# Patient Record
Sex: Male | Born: 1965 | Race: White | Hispanic: No | Marital: Single | Smoking: Never smoker
Health system: Southern US, Community
[De-identification: ages and names within clinical notes are randomized; demographics above are authoritative.]

## PROBLEM LIST (undated history)

## (undated) DIAGNOSIS — Z86010 Personal history of colonic polyps: Secondary | ICD-10-CM

## (undated) DIAGNOSIS — C8590 Non-Hodgkin lymphoma, unspecified, unspecified site: Secondary | ICD-10-CM

## (undated) DIAGNOSIS — R011 Cardiac murmur, unspecified: Secondary | ICD-10-CM

## (undated) DIAGNOSIS — N200 Calculus of kidney: Secondary | ICD-10-CM

## (undated) HISTORY — PX: OTHER SURGICAL HISTORY: SHX169

## (undated) HISTORY — DX: Personal history of colonic polyps: Z86.010

## (undated) HISTORY — PX: AXILLARY LYMPH NODE DISSECTION: SHX5229

## (undated) HISTORY — PX: WISDOM TOOTH EXTRACTION: SHX21

## (undated) HISTORY — DX: Cardiac murmur, unspecified: R01.1

## (undated) HISTORY — DX: Non-Hodgkin lymphoma, unspecified, unspecified site: C85.90

## (undated) HISTORY — DX: Calculus of kidney: N20.0

## (undated) HISTORY — PX: COLONOSCOPY: SHX174

---

## 2009-02-26 ENCOUNTER — Emergency Department (HOSPITAL_COMMUNITY): Admission: EM | Admit: 2009-02-26 | Discharge: 2009-02-26 | Payer: Self-pay | Admitting: Emergency Medicine

## 2010-10-12 ENCOUNTER — Ambulatory Visit: Payer: Self-pay | Admitting: Oncology

## 2010-11-07 ENCOUNTER — Encounter (HOSPITAL_BASED_OUTPATIENT_CLINIC_OR_DEPARTMENT_OTHER): Payer: PRIVATE HEALTH INSURANCE | Admitting: Oncology

## 2010-11-07 DIAGNOSIS — C8589 Other specified types of non-Hodgkin lymphoma, extranodal and solid organ sites: Secondary | ICD-10-CM

## 2011-05-08 ENCOUNTER — Encounter (HOSPITAL_BASED_OUTPATIENT_CLINIC_OR_DEPARTMENT_OTHER): Payer: PRIVATE HEALTH INSURANCE | Admitting: Oncology

## 2011-05-08 DIAGNOSIS — C8589 Other specified types of non-Hodgkin lymphoma, extranodal and solid organ sites: Secondary | ICD-10-CM

## 2011-09-28 ENCOUNTER — Telehealth: Payer: Self-pay | Admitting: Oncology

## 2011-09-28 NOTE — Telephone Encounter (Signed)
Pt called to schedule his feb 2013 appt

## 2011-11-08 ENCOUNTER — Ambulatory Visit (HOSPITAL_BASED_OUTPATIENT_CLINIC_OR_DEPARTMENT_OTHER): Payer: PRIVATE HEALTH INSURANCE | Admitting: Oncology

## 2011-11-08 VITALS — BP 142/82 | HR 46 | Temp 97.2°F | Ht 74.0 in | Wt 189.4 lb

## 2011-11-08 DIAGNOSIS — C8589 Other specified types of non-Hodgkin lymphoma, extranodal and solid organ sites: Secondary | ICD-10-CM

## 2011-11-08 NOTE — Progress Notes (Signed)
OFFICE PROGRESS NOTE   INTERVAL HISTORY:   He returns as scheduled. There are no palpable lymph nodes. He feels well. He has a good appetite and energy level. He denies fever and night sweats.  Objective:  Vital signs in last 24 hours:  Blood pressure 142/82, pulse 46, temperature 97.2 F (36.2 C), temperature source Oral, height 6\' 2"  (1.88 m), weight 189 lb 6.4 oz (85.911 kg).    HEENT: Oropharynx without visible mass. Neck without mass. Lymphatics: No cervical, supraclavicular, axillary, inguinal node, or femoral nodes Resp: Lungs clear bilaterally Cardio: Regular rate and rhythm GI: No hepatosplenomegaly, no mass Vascular: No leg edema    Medications: I have reviewed the patient's current medications.  Assessment/Plan: 1. 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  a. Status post 4 cycles of R-CHOP chemotherapy completed in February 2009.   b. Status post consolidative radiation completed in April 2009.   Disposition:  He remains in clinical remission from non-Hodgkin's lymphoma. He would like to continue followup at the cancer Center. He will return for an office visit in October of 2013.   Lucile Shutters, MD  11/08/2011  12:37 PM

## 2012-07-07 ENCOUNTER — Ambulatory Visit (HOSPITAL_BASED_OUTPATIENT_CLINIC_OR_DEPARTMENT_OTHER): Payer: PRIVATE HEALTH INSURANCE | Admitting: Oncology

## 2012-07-07 VITALS — BP 143/76 | HR 48 | Temp 97.7°F | Resp 20 | Ht 74.0 in | Wt 188.5 lb

## 2012-07-07 DIAGNOSIS — C8589 Other specified types of non-Hodgkin lymphoma, extranodal and solid organ sites: Secondary | ICD-10-CM

## 2012-07-07 DIAGNOSIS — C8584 Other specified types of non-Hodgkin lymphoma, lymph nodes of axilla and upper limb: Secondary | ICD-10-CM

## 2012-07-07 NOTE — Progress Notes (Signed)
   Paul Frank    OFFICE PROGRESS NOTE   INTERVAL HISTORY:   He returns as scheduled. He feels well. He continues to exercise regularly. No fever, night sweats, anorexia, or palpable lymph nodes.  Objective:  Vital signs in last 24 hours:  Blood pressure 143/76, pulse 48, temperature 97.7 F (36.5 C), temperature source Oral, resp. rate 20, height 6\' 2"  (1.88 m), weight 188 lb 8 oz (85.503 kg).    HEENT: Oropharynx without visible mass, neck without mass Lymphatics: No cervical, supraclavicular, axillary, or inguinal nodes Resp: Lungs clear bilaterally Cardio: Regular rate and rhythm GI: No hepatosplenomegaly Vascular: No leg edema   Medications: I have reviewed the patient's current medications.  Assessment/Plan: 1. 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  a. Status post 4 cycles of R-CHOP chemotherapy completed in February 2009.  b. Status post consolidative radiation completed in April 2009.  Disposition:  He remains in clinical remission from the non-Hodgkin's lymphoma. He is now 5 years out from the time of diagnosis. Paul Frank has an excellent prognosis for a long-term disease-free survival. He plans to establish with Dr. Felipa Eth for internal medicine care. He was discharged from the oncology clinic today. He will contact us for any symptoms concerning for recurrent lymphoma. We will be glad to see him in the future as needed.   Paul Papas, MD  07/07/2012  2:17 PM

## 2013-09-22 ENCOUNTER — Telehealth: Payer: Self-pay | Admitting: *Deleted

## 2013-09-22 NOTE — Telephone Encounter (Signed)
Reports 4 week history of some numbness in his right hand/arm and axillary soreness (same area where I had my lymphoma). Asking to make appointment to see Dr. Truett Perna to evaluate. Suggested he first have his PCP evaluate this in case it could be related to cervical disc issue (he admits history of C1 Fx in past) or some other neuro/ortho issue. Made him aware it is unlikely it is related to his lymphoma. He agrees to call Dr. Felipa Eth to evaluate this. Made him aware that Dr. Truett Perna will be notified of his call.

## 2015-04-08 ENCOUNTER — Encounter: Payer: Self-pay | Admitting: Internal Medicine

## 2015-05-16 NOTE — Progress Notes (Deleted)
No allergies to egg or soy products. No anesthesia complications.  No home O2.  No diet meds.

## 2015-05-17 ENCOUNTER — Ambulatory Visit (AMBULATORY_SURGERY_CENTER): Payer: Self-pay | Admitting: *Deleted

## 2015-05-17 VITALS — Ht 74.0 in | Wt 186.0 lb

## 2015-05-17 DIAGNOSIS — Z1211 Encounter for screening for malignant neoplasm of colon: Secondary | ICD-10-CM

## 2015-05-17 NOTE — Progress Notes (Signed)
Patient denies any allergies to egg or soy products. Patient denies complications with anesthesia/sedation.  Patient denies oxygen use at home and denies diet medications. Emmi instructions for colonoscopy explained and given to patient.  

## 2015-06-07 ENCOUNTER — Encounter: Payer: Self-pay | Admitting: Internal Medicine

## 2015-06-07 ENCOUNTER — Ambulatory Visit (AMBULATORY_SURGERY_CENTER): Payer: BLUE CROSS/BLUE SHIELD | Admitting: Internal Medicine

## 2015-06-07 VITALS — BP 125/76 | HR 36 | Temp 96.8°F | Resp 36 | Ht 74.0 in | Wt 186.0 lb

## 2015-06-07 DIAGNOSIS — D123 Benign neoplasm of transverse colon: Secondary | ICD-10-CM

## 2015-06-07 DIAGNOSIS — Z1211 Encounter for screening for malignant neoplasm of colon: Secondary | ICD-10-CM

## 2015-06-07 DIAGNOSIS — D122 Benign neoplasm of ascending colon: Secondary | ICD-10-CM | POA: Diagnosis not present

## 2015-06-07 DIAGNOSIS — D124 Benign neoplasm of descending colon: Secondary | ICD-10-CM

## 2015-06-07 MED ORDER — SODIUM CHLORIDE 0.9 % IV SOLN: 500.0000 mL | INTRAVENOUS | Status: DC

## 2015-06-07 NOTE — Progress Notes (Signed)
Transferred to recovery room. A/O x3, pleased with MAC.  VSS.  Report to Celia, RN. 

## 2015-06-07 NOTE — Patient Instructions (Addendum)
I found and removed 6 small-medium polyps. All look benign. You also have a condition called diverticulosis - common and not usually a problem. Please read the handout provided.  I will let you know pathology results and when to have another routine colonoscopy by mail.  I appreciate the opportunity to care for you. Gatha Mayer, MD, [facility]  Discharge instructions given. Handouts on polyps and diverticulosis. Resume previous medications. YOU HAD AN ENDOSCOPIC PROCEDURE TODAY AT [facility]:   Refer to the procedure report that was given to you for any specific questions about what was found during the examination.  If the procedure report does not answer your questions, please call your gastroenterologist to clarify.  If you requested that your care partner not be given the details of your procedure findings, then the procedure report has been included in a sealed envelope for you to review at your convenience later.  YOU SHOULD EXPECT: Some feelings of bloating in the abdomen. Passage of more gas than usual.  Walking can help get rid of the air that was put into your GI tract during the procedure and reduce the bloating. If you had a lower endoscopy (such as a colonoscopy or flexible sigmoidoscopy) you may notice spotting of blood in your stool or on the toilet paper. If you underwent a bowel prep for your procedure, you may not have a normal bowel movement for a few days.  Please Note:  You might notice some irritation and congestion in your nose or some drainage.  This is from the oxygen used during your procedure.  There is no need for concern and it should clear up in a day or so.  SYMPTOMS TO REPORT IMMEDIATELY:   Following lower endoscopy (colonoscopy or flexible sigmoidoscopy):  Excessive amounts of blood in the stool  Significant tenderness or worsening of abdominal pains  Swelling of the abdomen that is new, acute  Fever of 100F or higher  For urgent or  emergent issues, a gastroenterologist can be reached at any hour by calling [phone].   DIET: Your first meal following the procedure should be a small meal and then it is ok to progress to your normal diet. Heavy or fried foods are harder to digest and may make you feel nauseous or bloated.  Likewise, meals heavy in dairy and vegetables can increase bloating.  Drink plenty of fluids but you should avoid alcoholic beverages for 24 hours.  ACTIVITY:  You should plan to take it easy for the rest of today and you should NOT DRIVE or use heavy machinery until tomorrow (because of the sedation medicines used during the test).    FOLLOW UP: Our staff will call the number listed on your records the next business day following your procedure to check on you and address any questions or concerns that you may have regarding the information given to you following your procedure. If we do not reach you, we will leave a message.  However, if you are feeling well and you are not experiencing any problems, there is no need to return our call.  We will assume that you have returned to your regular daily activities without incident.  If any biopsies were taken you will be contacted by phone or by letter within the next 1-3 weeks.  Please call us at [phone] if you have not heard about the biopsies in 3 weeks.    SIGNATURES/CONFIDENTIALITY: You and/or your care partner have signed paperwork which  will be entered into your electronic medical record.  These signatures attest to the fact that that the information above on your After Visit Summary has been reviewed and is understood.  Full responsibility of the confidentiality of this discharge information lies with you and/or your care-partner.

## 2015-06-07 NOTE — Op Note (Signed)
[facility]  [address] [place], [address]   COLONOSCOPY PROCEDURE REPORT  PATIENT: Paul Frank, Paul Frank  MR#: [id number] BIRTHDATE: [birth year] , 48  yrs. old GENDER: male ENDOSCOPIST: Gatha Mayer, MD, [facility] PROCEDURE DATE:  06/07/2015 PROCEDURE:   Colonoscopy, screening, Colonoscopy with snare polypectomy, and Colonoscopy with biopsy First Screening Colonoscopy - Avg.  risk and is 50 yrs.  old or older Yes.  Prior Negative Screening - Now for repeat screening. N/A  History of Adenoma - Now for follow-up colonoscopy & has been > or = to 3 yrs.  N/A  Polyps removed today? Yes ASA CLASS:   Class I INDICATIONS:Screening for colonic neoplasia and Colorectal Neoplasm Risk Assessment for this procedure is average risk. MEDICATIONS: Propofol 300 mg IV and Monitored anesthesia care  DESCRIPTION OF PROCEDURE:   After the risks benefits and alternatives of the procedure were thoroughly explained, informed consent was obtained.  The digital rectal exam revealed no abnormalities of the rectum, revealed the prostate was not enlarged, and revealed no prostatic nodules.   The LB [id number]  endoscope was introduced through the anus and advanced to the cecum, which was identified by both the appendix and ileocecal valve. No adverse events experienced.   The quality of the prep was excellent.  (MiraLax was used)  The instrument was then slowly withdrawn as the colon was fully examined. Estimated blood loss is zero unless otherwise noted in this procedure report.  COLON FINDINGS: Six polypoid shaped sessile polyps ranging from 2 to 55mm in size were found in the ascending colon, transverse colon, and descending colon.  Polypectomies were performed with cold forceps and with a cold snare.  The resection was complete, the polyp tissue was completely retrieved and sent to histology. There was severe diverticulosis noted in the left colon.   The examination was otherwise normal.   Retroflexed views revealed no abnormalities. The time to cecum = 3.1 Withdrawal time = 21.9   The scope was withdrawn and the procedure completed. COMPLICATIONS: There were no immediate complications.  ENDOSCOPIC IMPRESSION: 1.   Six sessile polyps ranging from 2 to 62mm in size were found in the ascending colon (10 mm cold snare), transverse colon (diminutive cold bx), and descending colon (8 mm cold snare and 2 diminutive cold bx); polypectomies were performed with cold forceps and with a cold snare 2.   Severe diverticulosis was noted in the left colon 3.   The examination was otherwise normal - excellent prep - first screening  RECOMMENDATIONS: Timing of repeat colonoscopy will be determined by pathology findings.  eSigned:  Gatha Mayer, MD, [facility] 06/07/2015 9:18 AM   cc: Dr. Berneta Sages and The Patient

## 2015-06-07 NOTE — Progress Notes (Signed)
Called to room to assist during endoscopic procedure.  Patient ID and intended procedure confirmed with present staff. Received instructions for my participation in the procedure from the performing physician.  

## 2015-06-08 ENCOUNTER — Telehealth: Payer: Self-pay | Admitting: *Deleted

## 2015-06-08 NOTE — Telephone Encounter (Signed)
  Follow up Call-  Call back number 06/07/2015  Post procedure Call Back phone  # [phone]  Permission to leave phone message No     Patient questions:  Do you have a fever, pain , or abdominal swelling? No. Pain Score  0 *  Have you tolerated food without any problems? Yes.    Have you been able to return to your normal activities? Yes.    Do you have any questions about your discharge instructions: Diet   No. Medications  No. Follow up visit  No.  Do you have questions or concerns about your Care? No.  Actions: * If pain score is 4 or above: No action needed, pain <4.

## 2015-06-13 ENCOUNTER — Encounter: Payer: Self-pay | Admitting: Internal Medicine

## 2015-06-13 DIAGNOSIS — Z86010 Personal history of colonic polyps: Secondary | ICD-10-CM

## 2015-06-13 HISTORY — DX: Personal history of colonic polyps: Z86.010

## 2015-06-13 NOTE — Progress Notes (Signed)
Quick Note:  Adenomas and ssp - 6 polyps and max 10 mm Repeat colon 2019 ______

## 2015-06-27 ENCOUNTER — Encounter: Payer: PRIVATE HEALTH INSURANCE | Admitting: Internal Medicine

## 2016-04-06 DIAGNOSIS — Z0000 Encounter for general adult medical examination without abnormal findings: Secondary | ICD-10-CM | POA: Diagnosis not present

## 2016-04-06 DIAGNOSIS — Z125 Encounter for screening for malignant neoplasm of prostate: Secondary | ICD-10-CM | POA: Diagnosis not present

## 2016-04-13 DIAGNOSIS — Z0000 Encounter for general adult medical examination without abnormal findings: Secondary | ICD-10-CM | POA: Diagnosis not present

## 2016-04-13 DIAGNOSIS — M1990 Unspecified osteoarthritis, unspecified site: Secondary | ICD-10-CM | POA: Diagnosis not present

## 2016-04-13 DIAGNOSIS — Z1212 Encounter for screening for malignant neoplasm of rectum: Secondary | ICD-10-CM | POA: Diagnosis not present

## 2016-04-13 DIAGNOSIS — C8590 Non-Hodgkin lymphoma, unspecified, unspecified site: Secondary | ICD-10-CM | POA: Diagnosis not present

## 2016-04-13 DIAGNOSIS — D126 Benign neoplasm of colon, unspecified: Secondary | ICD-10-CM | POA: Diagnosis not present

## 2016-04-13 DIAGNOSIS — Z1389 Encounter for screening for other disorder: Secondary | ICD-10-CM | POA: Diagnosis not present

## 2016-07-28 DIAGNOSIS — Z23 Encounter for immunization: Secondary | ICD-10-CM | POA: Diagnosis not present

## 2017-05-13 DIAGNOSIS — Z0000 Encounter for general adult medical examination without abnormal findings: Secondary | ICD-10-CM | POA: Diagnosis not present

## 2017-05-13 DIAGNOSIS — Z125 Encounter for screening for malignant neoplasm of prostate: Secondary | ICD-10-CM | POA: Diagnosis not present

## 2017-05-14 DIAGNOSIS — Z1389 Encounter for screening for other disorder: Secondary | ICD-10-CM | POA: Diagnosis not present

## 2017-05-14 DIAGNOSIS — Z6823 Body mass index (BMI) 23.0-23.9, adult: Secondary | ICD-10-CM | POA: Diagnosis not present

## 2017-05-14 DIAGNOSIS — D126 Benign neoplasm of colon, unspecified: Secondary | ICD-10-CM | POA: Diagnosis not present

## 2017-05-14 DIAGNOSIS — Z0000 Encounter for general adult medical examination without abnormal findings: Secondary | ICD-10-CM | POA: Diagnosis not present

## 2017-05-14 DIAGNOSIS — C8590 Non-Hodgkin lymphoma, unspecified, unspecified site: Secondary | ICD-10-CM | POA: Diagnosis not present

## 2017-05-14 DIAGNOSIS — M1990 Unspecified osteoarthritis, unspecified site: Secondary | ICD-10-CM | POA: Diagnosis not present

## 2017-05-17 DIAGNOSIS — Z1212 Encounter for screening for malignant neoplasm of rectum: Secondary | ICD-10-CM | POA: Diagnosis not present

## 2017-07-13 DIAGNOSIS — Z23 Encounter for immunization: Secondary | ICD-10-CM | POA: Diagnosis not present

## 2017-08-01 DIAGNOSIS — D2362 Other benign neoplasm of skin of left upper limb, including shoulder: Secondary | ICD-10-CM | POA: Diagnosis not present

## 2017-08-01 DIAGNOSIS — L821 Other seborrheic keratosis: Secondary | ICD-10-CM | POA: Diagnosis not present

## 2017-08-01 DIAGNOSIS — D225 Melanocytic nevi of trunk: Secondary | ICD-10-CM | POA: Diagnosis not present

## 2017-08-01 DIAGNOSIS — D1801 Hemangioma of skin and subcutaneous tissue: Secondary | ICD-10-CM | POA: Diagnosis not present

## 2017-08-01 DIAGNOSIS — D485 Neoplasm of uncertain behavior of skin: Secondary | ICD-10-CM | POA: Diagnosis not present

## 2017-08-01 DIAGNOSIS — D2339 Other benign neoplasm of skin of other parts of face: Secondary | ICD-10-CM | POA: Diagnosis not present

## 2018-06-04 DIAGNOSIS — Z0000 Encounter for general adult medical examination without abnormal findings: Secondary | ICD-10-CM | POA: Diagnosis not present

## 2018-06-04 DIAGNOSIS — Z125 Encounter for screening for malignant neoplasm of prostate: Secondary | ICD-10-CM | POA: Diagnosis not present

## 2018-06-11 DIAGNOSIS — Z1389 Encounter for screening for other disorder: Secondary | ICD-10-CM | POA: Diagnosis not present

## 2018-06-11 DIAGNOSIS — C8590 Non-Hodgkin lymphoma, unspecified, unspecified site: Secondary | ICD-10-CM | POA: Diagnosis not present

## 2018-06-11 DIAGNOSIS — Z0000 Encounter for general adult medical examination without abnormal findings: Secondary | ICD-10-CM | POA: Diagnosis not present

## 2018-06-11 DIAGNOSIS — Z23 Encounter for immunization: Secondary | ICD-10-CM | POA: Diagnosis not present

## 2018-06-11 DIAGNOSIS — D126 Benign neoplasm of colon, unspecified: Secondary | ICD-10-CM | POA: Diagnosis not present

## 2018-06-11 DIAGNOSIS — M1990 Unspecified osteoarthritis, unspecified site: Secondary | ICD-10-CM | POA: Diagnosis not present

## 2018-06-13 DIAGNOSIS — Z1212 Encounter for screening for malignant neoplasm of rectum: Secondary | ICD-10-CM | POA: Diagnosis not present

## 2018-06-23 ENCOUNTER — Encounter: Payer: Self-pay | Admitting: Internal Medicine

## 2018-06-24 ENCOUNTER — Telehealth: Payer: Self-pay | Admitting: Oncology

## 2018-06-24 NOTE — Telephone Encounter (Signed)
Printed medical records for patient to pick up, Release [id number]

## 2018-07-25 ENCOUNTER — Ambulatory Visit (AMBULATORY_SURGERY_CENTER): Payer: Self-pay | Admitting: *Deleted

## 2018-07-25 ENCOUNTER — Other Ambulatory Visit: Payer: Self-pay

## 2018-07-25 VITALS — Ht 74.0 in | Wt 182.0 lb

## 2018-07-25 DIAGNOSIS — Z86010 Personal history of colonic polyps: Secondary | ICD-10-CM

## 2018-07-25 NOTE — Progress Notes (Signed)
No egg or soy allergy known to patient  No issues with past sedation with any surgeries  or procedures, no intubation problems  No diet pills per patient No home 02 use per patient  No blood thinners per patient  Pt denies issues with constipation  No A fib or A flutter  EMMI video sent to pt's e mail  

## 2018-07-28 ENCOUNTER — Encounter: Payer: Self-pay | Admitting: Internal Medicine

## 2018-08-08 ENCOUNTER — Encounter: Payer: Self-pay | Admitting: Internal Medicine

## 2018-08-08 ENCOUNTER — Ambulatory Visit (AMBULATORY_SURGERY_CENTER): Payer: BLUE CROSS/BLUE SHIELD | Admitting: Internal Medicine

## 2018-08-08 VITALS — BP 108/66 | HR 53 | Temp 97.8°F | Resp 12 | Ht 74.0 in | Wt 182.0 lb

## 2018-08-08 DIAGNOSIS — K635 Polyp of colon: Secondary | ICD-10-CM | POA: Diagnosis not present

## 2018-08-08 DIAGNOSIS — Z86010 Personal history of colonic polyps: Secondary | ICD-10-CM | POA: Diagnosis not present

## 2018-08-08 DIAGNOSIS — Z1211 Encounter for screening for malignant neoplasm of colon: Secondary | ICD-10-CM | POA: Diagnosis not present

## 2018-08-08 DIAGNOSIS — D124 Benign neoplasm of descending colon: Secondary | ICD-10-CM

## 2018-08-08 DIAGNOSIS — D127 Benign neoplasm of rectosigmoid junction: Secondary | ICD-10-CM | POA: Diagnosis not present

## 2018-08-08 DIAGNOSIS — D125 Benign neoplasm of sigmoid colon: Secondary | ICD-10-CM

## 2018-08-08 DIAGNOSIS — D129 Benign neoplasm of anus and anal canal: Secondary | ICD-10-CM

## 2018-08-08 DIAGNOSIS — D128 Benign neoplasm of rectum: Secondary | ICD-10-CM

## 2018-08-08 DIAGNOSIS — K621 Rectal polyp: Secondary | ICD-10-CM | POA: Diagnosis not present

## 2018-08-08 MED ORDER — SODIUM CHLORIDE 0.9 % IV SOLN: 500.0000 mL | Freq: Once | INTRAVENOUS | Status: AC

## 2018-08-08 NOTE — Progress Notes (Signed)
To PACU, VSS. Report to [email or web address] 

## 2018-08-08 NOTE — Op Note (Signed)
[facility] Patient Name: Paul Frank Procedure Date: 08/08/2018 9:26 AM MRN: [id number] Endoscopist: Gatha Mayer , MD Age: 52 Referring MD:  Date of Birth: 03-02-1966 Gender: Male Account #: [id number] Procedure:                Colonoscopy Indications:              Surveillance: Personal history of adenomatous                            polyps on last colonoscopy 3 years ago Medicines:                Propofol per Anesthesia, Monitored Anesthesia Care Procedure:                Pre-Anesthesia Assessment:                           - Prior to the procedure, a History and Physical                            was performed, and patient medications and                            allergies were reviewed. The patient's tolerance of                            previous anesthesia was also reviewed. The risks                            and benefits of the procedure and the sedation                            options and risks were discussed with the patient.                            All questions were answered, and informed consent                            was obtained. Prior Anticoagulants: The patient has                            taken no previous anticoagulant or antiplatelet                            agents. ASA Grade Assessment: II - A patient with                            mild systemic disease. After reviewing the risks                            and benefits, the patient was deemed in                            satisfactory condition to undergo the procedure.  After obtaining informed consent, the colonoscope                            was passed under direct vision. Throughout the                            procedure, the patient's blood pressure, pulse, and                            oxygen saturations were monitored continuously. The                            Model CF-[id number]) scope was introduced   through the anus and advanced to the the cecum,                            identified by appendiceal orifice and ileocecal                            valve. The colonoscopy was performed without                            difficulty. The patient tolerated the procedure                            well. The quality of the bowel preparation was                            excellent. The ileocecal valve, appendiceal                            orifice, and rectum were photographed. Scope In: 9:35:40 AM Scope Out: 9:59:53 AM Scope Withdrawal Time: 0 hours 19 minutes 54 seconds  Total Procedure Duration: 0 hours 24 minutes 13 seconds  Findings:                 The perianal and digital rectal examinations were                            normal. Pertinent negatives include normal prostate                            (size, shape, and consistency).                           Scattered small-mouthed diverticula were found in                            the entire colon.                           Five sessile polyps were found in the rectum,                            sigmoid colon and descending colon. The polyps were  diminutive in size. These polyps were removed with                            a cold snare. Resection and retrieval were                            complete. Verification of patient identification                            for the specimen was done. Estimated blood loss was                            minimal.                           The exam was otherwise without abnormality on                            direct and retroflexion views. Complications:            No immediate complications. Estimated Blood Loss:     Estimated blood loss was minimal. Impression:               - Diverticulosis in the entire examined colon.                           - Five diminutive polyps in the rectum, in the                            sigmoid colon and in the descending colon,  removed                            with a cold snare. Resected and retrieved.                           - The examination was otherwise normal on direct                            and retroflexion views.                           - Personal history of colonic polyps. Adenomas and                            ssp's 2016 Recommendation:           - Patient has a contact number available for                            emergencies. The signs and symptoms of potential                            delayed complications were discussed with the                            patient. Return to normal  activities tomorrow.                            Written discharge instructions were provided to the                            patient.                           - Resume previous diet.                           - Continue present medications.                           - Repeat colonoscopy is recommended for                            surveillance. The colonoscopy date will be                            determined after pathology results from today's                            exam become available for review. Gatha Mayer, MD 08/08/2018 10:14:18 AM This report has been signed electronically.

## 2018-08-08 NOTE — Progress Notes (Signed)
Patient denies any changes in medical, surgical or medicine hx since PV.

## 2018-08-08 NOTE — Progress Notes (Signed)
Called to room to assist during endoscopic procedure.  Patient ID and intended procedure confirmed with present staff. Received instructions for my participation in the procedure from the performing physician.  

## 2018-08-08 NOTE — Patient Instructions (Addendum)
I found and removed 5 tiny polyps.  You still have diverticulosis - thickened muscle rings and pouches in the colon wall. Please read the handout about this condition.  I appreciate the opportunity to care for you. Gatha Mayer, MD, FACG    YOU HAD AN ENDOSCOPIC PROCEDURE TODAY AT [facility]:   Refer to the procedure report that was given to you for any specific questions about what was found during the examination.  If the procedure report does not answer your questions, please call your gastroenterologist to clarify.  If you requested that your care partner not be given the details of your procedure findings, then the procedure report has been included in a sealed envelope for you to review at your convenience later.  YOU SHOULD EXPECT: Some feelings of bloating in the abdomen. Passage of more gas than usual.  Walking can help get rid of the air that was put into your GI tract during the procedure and reduce the bloating. If you had a lower endoscopy (such as a colonoscopy or flexible sigmoidoscopy) you may notice spotting of blood in your stool or on the toilet paper. If you underwent a bowel prep for your procedure, you may not have a normal bowel movement for a few days.  Please Note:  You might notice some irritation and congestion in your nose or some drainage.  This is from the oxygen used during your procedure.  There is no need for concern and it should clear up in a day or so.  SYMPTOMS TO REPORT IMMEDIATELY:   Following lower endoscopy (colonoscopy or flexible sigmoidoscopy):  Excessive amounts of blood in the stool  Significant tenderness or worsening of abdominal pains  Swelling of the abdomen that is new, acute  Fever of 100F or higher   For urgent or emergent issues, a gastroenterologist can be reached at any hour by calling [phone].   DIET:  We do recommend a small meal at first, but then you may proceed to your regular diet.  Drink  plenty of fluids but you should avoid alcoholic beverages for 24 hours.  ACTIVITY:  You should plan to take it easy for the rest of today and you should NOT DRIVE or use heavy machinery until tomorrow (because of the sedation medicines used during the test).    FOLLOW UP: Our staff will call the number listed on your records the next business day following your procedure to check on you and address any questions or concerns that you may have regarding the information given to you following your procedure. If we do not reach you, we will leave a message.  However, if you are feeling well and you are not experiencing any problems, there is no need to return our call.  We will assume that you have returned to your regular daily activities without incident.  If any biopsies were taken you will be contacted by phone or by letter within the next 1-3 weeks.  Please call us at [phone] if you have not heard about the biopsies in 3 weeks.    SIGNATURES/CONFIDENTIALITY: You and/or your care partner have signed paperwork which will be entered into your electronic medical record.  These signatures attest to the fact that that the information above on your After Visit Summary has been reviewed and is understood.  Full responsibility of the confidentiality of this discharge information lies with you and/or your care-partner.  Read all of the handouts given to  you by your recovery room nurse.

## 2018-08-11 ENCOUNTER — Telehealth: Payer: Self-pay

## 2018-08-11 NOTE — Telephone Encounter (Signed)
  Follow up Call-  Call back number 08/08/2018  Post procedure Call Back phone  # [phone]  Permission to leave phone message Yes  Some recent data might be hidden     Patient questions:  Do you have a fever, pain , or abdominal swelling? No. Pain Score  0 *  Have you tolerated food without any problems? Yes.    Have you been able to return to your normal activities? Yes.    Do you have any questions about your discharge instructions: Diet   No. Medications  No. Follow up visit  No.  Do you have questions or concerns about your Care? No.  Actions: * If pain score is 4 or above: No action needed, pain <4.

## 2018-08-20 ENCOUNTER — Encounter: Payer: Self-pay | Admitting: Internal Medicine

## 2018-08-20 DIAGNOSIS — Z860101 Personal history of adenomatous and serrated colon polyps: Secondary | ICD-10-CM

## 2018-08-20 DIAGNOSIS — Z86010 Personal history of colonic polyps: Secondary | ICD-10-CM

## 2018-08-20 NOTE — Progress Notes (Signed)
Diminutive hyperplastics desc, simoid and rectum 5 total Hx adenomas ssp's 2016  Recall 2022

## 2019-06-26 DIAGNOSIS — Z0000 Encounter for general adult medical examination without abnormal findings: Secondary | ICD-10-CM | POA: Diagnosis not present

## 2019-06-26 DIAGNOSIS — Z125 Encounter for screening for malignant neoplasm of prostate: Secondary | ICD-10-CM | POA: Diagnosis not present

## 2019-06-26 DIAGNOSIS — R945 Abnormal results of liver function studies: Secondary | ICD-10-CM | POA: Diagnosis not present

## 2019-07-03 DIAGNOSIS — Z23 Encounter for immunization: Secondary | ICD-10-CM | POA: Diagnosis not present

## 2019-07-03 DIAGNOSIS — Z125 Encounter for screening for malignant neoplasm of prostate: Secondary | ICD-10-CM | POA: Diagnosis not present

## 2019-07-03 DIAGNOSIS — C8590 Non-Hodgkin lymphoma, unspecified, unspecified site: Secondary | ICD-10-CM | POA: Diagnosis not present

## 2019-07-03 DIAGNOSIS — R945 Abnormal results of liver function studies: Secondary | ICD-10-CM | POA: Diagnosis not present

## 2019-07-03 DIAGNOSIS — Z1331 Encounter for screening for depression: Secondary | ICD-10-CM | POA: Diagnosis not present

## 2019-07-03 DIAGNOSIS — M1990 Unspecified osteoarthritis, unspecified site: Secondary | ICD-10-CM | POA: Diagnosis not present

## 2019-07-03 DIAGNOSIS — Z0000 Encounter for general adult medical examination without abnormal findings: Secondary | ICD-10-CM | POA: Diagnosis not present

## 2019-07-03 DIAGNOSIS — D126 Benign neoplasm of colon, unspecified: Secondary | ICD-10-CM | POA: Diagnosis not present

## 2019-07-09 DIAGNOSIS — Z1212 Encounter for screening for malignant neoplasm of rectum: Secondary | ICD-10-CM | POA: Diagnosis not present

## 2019-07-09 DIAGNOSIS — R945 Abnormal results of liver function studies: Secondary | ICD-10-CM | POA: Diagnosis not present

## 2019-07-15 ENCOUNTER — Other Ambulatory Visit: Payer: Self-pay | Admitting: Internal Medicine

## 2019-07-15 DIAGNOSIS — R945 Abnormal results of liver function studies: Secondary | ICD-10-CM

## 2019-07-15 DIAGNOSIS — R7989 Other specified abnormal findings of blood chemistry: Secondary | ICD-10-CM

## 2019-07-22 ENCOUNTER — Ambulatory Visit
Admission: RE | Admit: 2019-07-22 | Discharge: 2019-07-22 | Disposition: A | Discharge status: Home / Self Care | Payer: BC Managed Care – PPO | Source: Ambulatory Visit | Attending: Internal Medicine | Admitting: Internal Medicine

## 2019-07-22 DIAGNOSIS — R945 Abnormal results of liver function studies: Secondary | ICD-10-CM

## 2019-07-22 DIAGNOSIS — R7989 Other specified abnormal findings of blood chemistry: Secondary | ICD-10-CM

## 2019-07-22 DIAGNOSIS — K828 Other specified diseases of gallbladder: Secondary | ICD-10-CM | POA: Diagnosis not present

## 2019-07-25 ENCOUNTER — Encounter (INDEPENDENT_AMBULATORY_CARE_PROVIDER_SITE_OTHER): Payer: Self-pay

## 2019-08-05 DIAGNOSIS — R945 Abnormal results of liver function studies: Secondary | ICD-10-CM | POA: Diagnosis not present

## 2019-08-14 ENCOUNTER — Other Ambulatory Visit: Payer: Self-pay | Admitting: Internal Medicine

## 2019-08-14 DIAGNOSIS — R945 Abnormal results of liver function studies: Secondary | ICD-10-CM

## 2019-08-14 DIAGNOSIS — R7989 Other specified abnormal findings of blood chemistry: Secondary | ICD-10-CM

## 2019-08-25 ENCOUNTER — Other Ambulatory Visit: Payer: Self-pay

## 2019-08-25 ENCOUNTER — Ambulatory Visit
Admission: RE | Admit: 2019-08-25 | Discharge: 2019-08-25 | Disposition: A | Discharge status: Home / Self Care | Payer: BC Managed Care – PPO | Source: Ambulatory Visit | Attending: Internal Medicine | Admitting: Internal Medicine

## 2019-08-25 DIAGNOSIS — R945 Abnormal results of liver function studies: Secondary | ICD-10-CM

## 2019-08-25 DIAGNOSIS — R7989 Other specified abnormal findings of blood chemistry: Secondary | ICD-10-CM

## 2019-08-25 MED ORDER — IOPAMIDOL (ISOVUE-300) INJECTION 61%
100.0000 mL | Freq: Once | INTRAVENOUS | Status: AC | PRN
  Administered 2019-08-25: 100 mL via INTRAVENOUS

## 2019-09-03 DIAGNOSIS — M25561 Pain in right knee: Secondary | ICD-10-CM | POA: Diagnosis not present

## 2019-09-23 DIAGNOSIS — R945 Abnormal results of liver function studies: Secondary | ICD-10-CM | POA: Diagnosis not present

## 2019-10-09 DIAGNOSIS — L821 Other seborrheic keratosis: Secondary | ICD-10-CM | POA: Diagnosis not present

## 2019-10-09 DIAGNOSIS — L820 Inflamed seborrheic keratosis: Secondary | ICD-10-CM | POA: Diagnosis not present

## 2019-11-04 DIAGNOSIS — T84194A Other mechanical complication of internal fixation device of right femur, initial encounter: Secondary | ICD-10-CM | POA: Diagnosis not present

## 2019-11-04 DIAGNOSIS — X58XXXA Exposure to other specified factors, initial encounter: Secondary | ICD-10-CM | POA: Diagnosis not present

## 2019-11-04 DIAGNOSIS — S83271A Complex tear of lateral meniscus, current injury, right knee, initial encounter: Secondary | ICD-10-CM | POA: Diagnosis not present

## 2019-11-04 DIAGNOSIS — M1711 Unilateral primary osteoarthritis, right knee: Secondary | ICD-10-CM | POA: Diagnosis not present

## 2019-11-04 DIAGNOSIS — T84498A Other mechanical complication of other internal orthopedic devices, implants and grafts, initial encounter: Secondary | ICD-10-CM | POA: Diagnosis not present

## 2019-11-04 DIAGNOSIS — S83281A Other tear of lateral meniscus, current injury, right knee, initial encounter: Secondary | ICD-10-CM | POA: Diagnosis not present

## 2019-11-04 DIAGNOSIS — Y999 Unspecified external cause status: Secondary | ICD-10-CM | POA: Diagnosis not present

## 2019-11-04 DIAGNOSIS — M94261 Chondromalacia, right knee: Secondary | ICD-10-CM | POA: Diagnosis not present

## 2019-11-09 DIAGNOSIS — S83271D Complex tear of lateral meniscus, current injury, right knee, subsequent encounter: Secondary | ICD-10-CM | POA: Diagnosis not present

## 2019-11-09 DIAGNOSIS — M25661 Stiffness of right knee, not elsewhere classified: Secondary | ICD-10-CM | POA: Diagnosis not present

## 2019-11-09 DIAGNOSIS — M6281 Muscle weakness (generalized): Secondary | ICD-10-CM | POA: Diagnosis not present

## 2019-11-09 DIAGNOSIS — M1711 Unilateral primary osteoarthritis, right knee: Secondary | ICD-10-CM | POA: Diagnosis not present

## 2019-11-12 DIAGNOSIS — M25661 Stiffness of right knee, not elsewhere classified: Secondary | ICD-10-CM | POA: Diagnosis not present

## 2019-11-12 DIAGNOSIS — M1711 Unilateral primary osteoarthritis, right knee: Secondary | ICD-10-CM | POA: Diagnosis not present

## 2019-11-12 DIAGNOSIS — M25561 Pain in right knee: Secondary | ICD-10-CM | POA: Diagnosis not present

## 2019-11-12 DIAGNOSIS — M6281 Muscle weakness (generalized): Secondary | ICD-10-CM | POA: Diagnosis not present

## 2019-11-12 DIAGNOSIS — R262 Difficulty in walking, not elsewhere classified: Secondary | ICD-10-CM | POA: Diagnosis not present

## 2019-11-16 DIAGNOSIS — R262 Difficulty in walking, not elsewhere classified: Secondary | ICD-10-CM | POA: Diagnosis not present

## 2019-11-16 DIAGNOSIS — M25661 Stiffness of right knee, not elsewhere classified: Secondary | ICD-10-CM | POA: Diagnosis not present

## 2019-11-16 DIAGNOSIS — M1711 Unilateral primary osteoarthritis, right knee: Secondary | ICD-10-CM | POA: Diagnosis not present

## 2019-11-16 DIAGNOSIS — S83271D Complex tear of lateral meniscus, current injury, right knee, subsequent encounter: Secondary | ICD-10-CM | POA: Diagnosis not present

## 2019-11-20 DIAGNOSIS — M1711 Unilateral primary osteoarthritis, right knee: Secondary | ICD-10-CM | POA: Diagnosis not present

## 2019-11-20 DIAGNOSIS — M6281 Muscle weakness (generalized): Secondary | ICD-10-CM | POA: Diagnosis not present

## 2019-11-20 DIAGNOSIS — S83271D Complex tear of lateral meniscus, current injury, right knee, subsequent encounter: Secondary | ICD-10-CM | POA: Diagnosis not present

## 2019-11-20 DIAGNOSIS — M25661 Stiffness of right knee, not elsewhere classified: Secondary | ICD-10-CM | POA: Diagnosis not present

## 2019-11-23 DIAGNOSIS — S83271D Complex tear of lateral meniscus, current injury, right knee, subsequent encounter: Secondary | ICD-10-CM | POA: Diagnosis not present

## 2019-11-23 DIAGNOSIS — M6281 Muscle weakness (generalized): Secondary | ICD-10-CM | POA: Diagnosis not present

## 2019-11-23 DIAGNOSIS — M1711 Unilateral primary osteoarthritis, right knee: Secondary | ICD-10-CM | POA: Diagnosis not present

## 2019-11-23 DIAGNOSIS — M25661 Stiffness of right knee, not elsewhere classified: Secondary | ICD-10-CM | POA: Diagnosis not present

## 2019-11-26 DIAGNOSIS — M6281 Muscle weakness (generalized): Secondary | ICD-10-CM | POA: Diagnosis not present

## 2019-11-26 DIAGNOSIS — M25661 Stiffness of right knee, not elsewhere classified: Secondary | ICD-10-CM | POA: Diagnosis not present

## 2019-11-26 DIAGNOSIS — R262 Difficulty in walking, not elsewhere classified: Secondary | ICD-10-CM | POA: Diagnosis not present

## 2019-11-26 DIAGNOSIS — M25561 Pain in right knee: Secondary | ICD-10-CM | POA: Diagnosis not present

## 2019-11-30 DIAGNOSIS — M1711 Unilateral primary osteoarthritis, right knee: Secondary | ICD-10-CM | POA: Diagnosis not present

## 2019-11-30 DIAGNOSIS — T84194D Other mechanical complication of internal fixation device of right femur, subsequent encounter: Secondary | ICD-10-CM | POA: Diagnosis not present

## 2019-11-30 DIAGNOSIS — M25661 Stiffness of right knee, not elsewhere classified: Secondary | ICD-10-CM | POA: Diagnosis not present

## 2019-11-30 DIAGNOSIS — S83271D Complex tear of lateral meniscus, current injury, right knee, subsequent encounter: Secondary | ICD-10-CM | POA: Diagnosis not present

## 2019-12-03 DIAGNOSIS — R262 Difficulty in walking, not elsewhere classified: Secondary | ICD-10-CM | POA: Diagnosis not present

## 2019-12-03 DIAGNOSIS — M1711 Unilateral primary osteoarthritis, right knee: Secondary | ICD-10-CM | POA: Diagnosis not present

## 2019-12-03 DIAGNOSIS — M25661 Stiffness of right knee, not elsewhere classified: Secondary | ICD-10-CM | POA: Diagnosis not present

## 2019-12-03 DIAGNOSIS — S83271D Complex tear of lateral meniscus, current injury, right knee, subsequent encounter: Secondary | ICD-10-CM | POA: Diagnosis not present

## 2019-12-10 DIAGNOSIS — S83271D Complex tear of lateral meniscus, current injury, right knee, subsequent encounter: Secondary | ICD-10-CM | POA: Diagnosis not present

## 2019-12-10 DIAGNOSIS — M1711 Unilateral primary osteoarthritis, right knee: Secondary | ICD-10-CM | POA: Diagnosis not present

## 2019-12-10 DIAGNOSIS — M6281 Muscle weakness (generalized): Secondary | ICD-10-CM | POA: Diagnosis not present

## 2019-12-10 DIAGNOSIS — M25661 Stiffness of right knee, not elsewhere classified: Secondary | ICD-10-CM | POA: Diagnosis not present

## 2020-03-25 DIAGNOSIS — M1990 Unspecified osteoarthritis, unspecified site: Secondary | ICD-10-CM | POA: Diagnosis not present

## 2020-03-25 DIAGNOSIS — R945 Abnormal results of liver function studies: Secondary | ICD-10-CM | POA: Diagnosis not present

## 2020-03-25 DIAGNOSIS — R030 Elevated blood-pressure reading, without diagnosis of hypertension: Secondary | ICD-10-CM | POA: Diagnosis not present

## 2020-03-25 DIAGNOSIS — Z01818 Encounter for other preprocedural examination: Secondary | ICD-10-CM | POA: Diagnosis not present

## 2020-03-28 DIAGNOSIS — M1712 Unilateral primary osteoarthritis, left knee: Secondary | ICD-10-CM | POA: Diagnosis not present

## 2020-03-28 DIAGNOSIS — M898X5 Other specified disorders of bone, thigh: Secondary | ICD-10-CM | POA: Diagnosis not present

## 2020-04-14 DIAGNOSIS — M1712 Unilateral primary osteoarthritis, left knee: Secondary | ICD-10-CM | POA: Diagnosis not present

## 2020-04-15 DIAGNOSIS — Z01812 Encounter for preprocedural laboratory examination: Secondary | ICD-10-CM | POA: Diagnosis not present

## 2020-04-19 DIAGNOSIS — M1712 Unilateral primary osteoarthritis, left knee: Secondary | ICD-10-CM | POA: Diagnosis not present

## 2020-04-29 DIAGNOSIS — Z96651 Presence of right artificial knee joint: Secondary | ICD-10-CM | POA: Diagnosis not present

## 2020-04-29 DIAGNOSIS — M1712 Unilateral primary osteoarthritis, left knee: Secondary | ICD-10-CM | POA: Diagnosis not present

## 2020-06-14 DIAGNOSIS — Z96652 Presence of left artificial knee joint: Secondary | ICD-10-CM | POA: Diagnosis not present

## 2020-06-14 DIAGNOSIS — M25562 Pain in left knee: Secondary | ICD-10-CM | POA: Diagnosis not present

## 2020-08-03 DIAGNOSIS — Z0000 Encounter for general adult medical examination without abnormal findings: Secondary | ICD-10-CM | POA: Diagnosis not present

## 2020-08-03 DIAGNOSIS — Z125 Encounter for screening for malignant neoplasm of prostate: Secondary | ICD-10-CM | POA: Diagnosis not present

## 2020-08-10 DIAGNOSIS — Z23 Encounter for immunization: Secondary | ICD-10-CM | POA: Diagnosis not present

## 2020-08-10 DIAGNOSIS — Z0000 Encounter for general adult medical examination without abnormal findings: Secondary | ICD-10-CM | POA: Diagnosis not present

## 2020-08-24 DIAGNOSIS — Z1212 Encounter for screening for malignant neoplasm of rectum: Secondary | ICD-10-CM | POA: Diagnosis not present

## 2020-09-20 DIAGNOSIS — Z96652 Presence of left artificial knee joint: Secondary | ICD-10-CM | POA: Diagnosis not present

## 2020-09-20 DIAGNOSIS — M25562 Pain in left knee: Secondary | ICD-10-CM | POA: Diagnosis not present

## 2021-04-24 DIAGNOSIS — M47816 Spondylosis without myelopathy or radiculopathy, lumbar region: Secondary | ICD-10-CM | POA: Diagnosis not present

## 2021-04-24 DIAGNOSIS — M419 Scoliosis, unspecified: Secondary | ICD-10-CM | POA: Diagnosis not present

## 2021-04-24 DIAGNOSIS — M5136 Other intervertebral disc degeneration, lumbar region: Secondary | ICD-10-CM | POA: Diagnosis not present

## 2021-08-30 DIAGNOSIS — R030 Elevated blood-pressure reading, without diagnosis of hypertension: Secondary | ICD-10-CM | POA: Diagnosis not present

## 2021-08-30 DIAGNOSIS — Z125 Encounter for screening for malignant neoplasm of prostate: Secondary | ICD-10-CM | POA: Diagnosis not present

## 2021-09-07 DIAGNOSIS — Z0000 Encounter for general adult medical examination without abnormal findings: Secondary | ICD-10-CM | POA: Diagnosis not present

## 2021-09-07 DIAGNOSIS — M1990 Unspecified osteoarthritis, unspecified site: Secondary | ICD-10-CM | POA: Diagnosis not present

## 2021-09-13 ENCOUNTER — Telehealth: Payer: Self-pay | Admitting: Internal Medicine

## 2021-09-13 NOTE — Telephone Encounter (Signed)
The appropriate recall is 2024 I believe I made a typo on the path result note  Is he having any signs or symptoms that would indicate a need for colonoscopy?  Can always make an office visit to discuss

## 2021-09-13 NOTE — Telephone Encounter (Signed)
Patient called stating he wanted to make a colonoscopy appointment.  He has a recall for 2024 but on procedure visit from 2018 says 2022.  Please advise on which is correct

## 2021-09-13 NOTE — Telephone Encounter (Signed)
Pt states that he is wanting to schedule a colonoscopy. Pt was notified from the Letter that was sent to pt  on 08/20/2018 stated that he should repeat Colonoscopy in 5 years.  Recall in [place] is for 08/02/2023  Path report on 08/08/2018 states the following:  Diminutive hyperplastics desc, simoid and rectum 5 total Hx adenomas ssp's 2016  Recall 2022   Please advise

## 2021-09-14 NOTE — Telephone Encounter (Signed)
Pt made aware of Dr. Gessner recommendations: Pt verbalized understanding with all questions answered.   

## 2021-09-19 IMAGING — CT CT ABDOMEN WO/W CM
4 of 7 series · 12 of 32 positions shown, 17 images · IV contrast (APPLIED)
Comparison: Ultrasound evaluation dated 07/22/2019

CLINICAL DATA: Variant vascular anatomy

EXAM:
CT ABDOMEN WITHOUT AND WITH CONTRAST
TECHNIQUE: Multidetector CT imaging of the abdomen was performed following the
standard protocol before and following the bolus administration of
intravenous contrast.
CONTRAST:  100mL YHC79K-4PP IOPAMIDOL (YHC79K-4PP) INJECTION 61%

[Series 2: liver w/(date) · axial · 0.77mm/px · z∈[-230,-155]mm · 2 of 47 slices shown]
[im 16/47  soft-tissue]
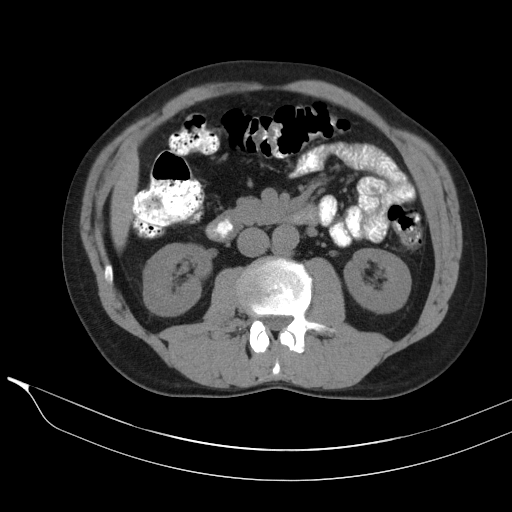
[im 31/47  soft-tissue]
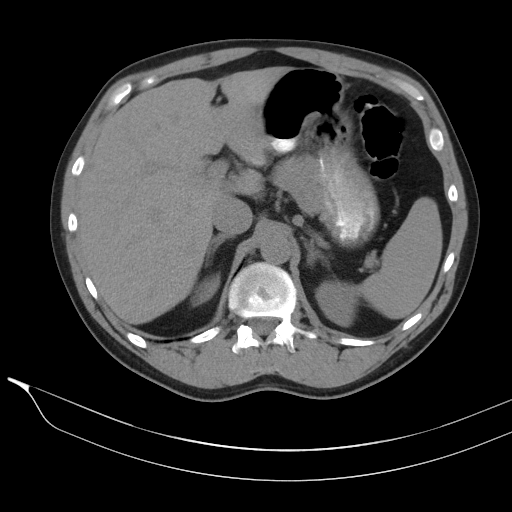

[Series 4: arterial · axial · arterial · 0.77mm/px · z∈[-262,-121]mm · 4 of 79 slices shown]
[im 16/79  soft-tissue]
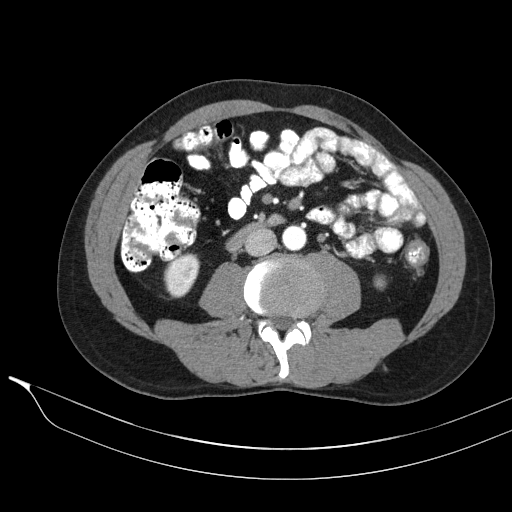
[im 32/79  soft-tissue]
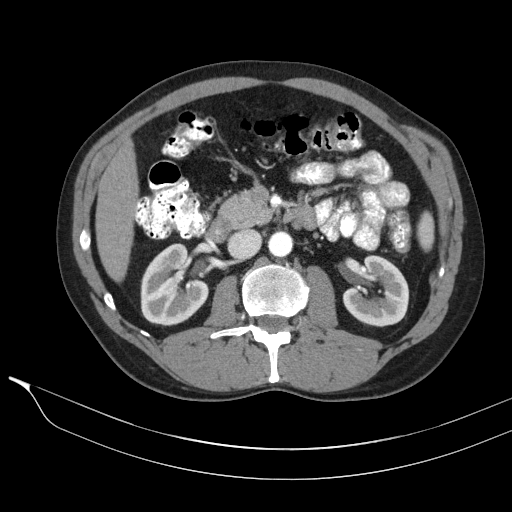
[im 47/79  soft-tissue]
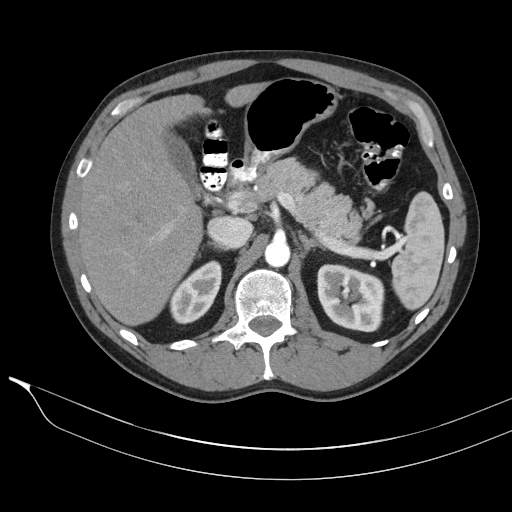
[im 63/79  soft-tissue]
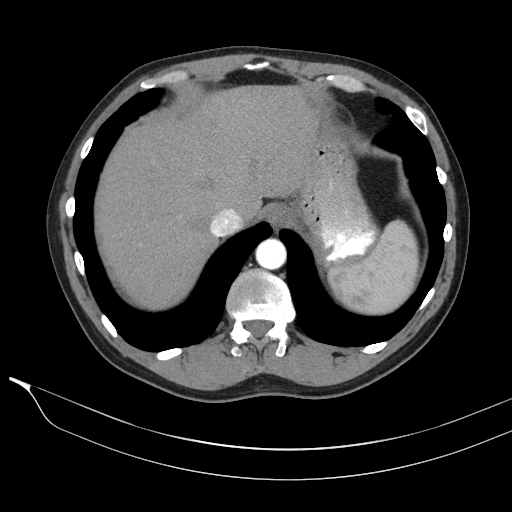

[Series 5: venous · axial · portal-venous · 0.77mm/px · z∈[-264,-123]mm · 4 of 79 slices shown, 9 images]
[im 16/79  soft-tissue]
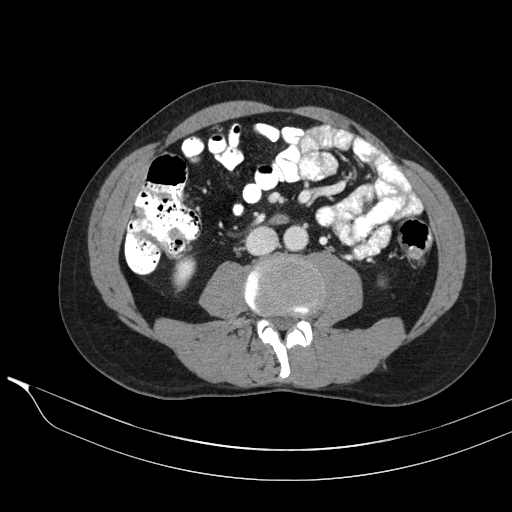
[im 16/79  lung]
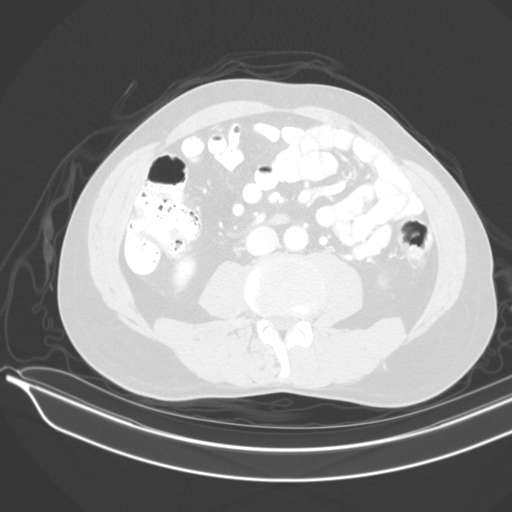
[im 16/79  bone]
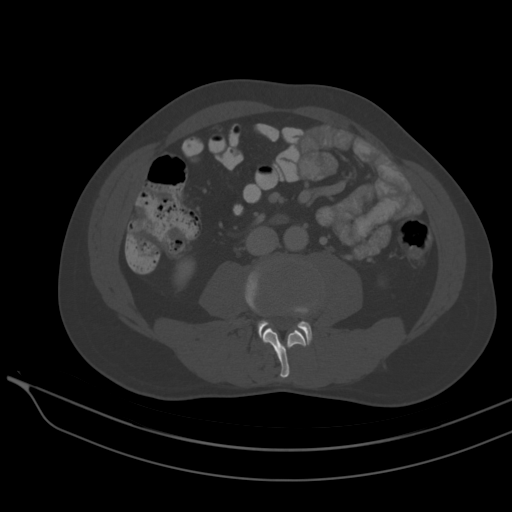
[im 32/79  soft-tissue]
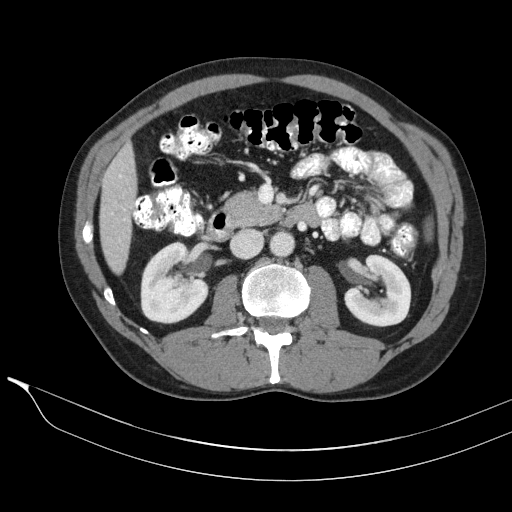
[im 32/79  lung]
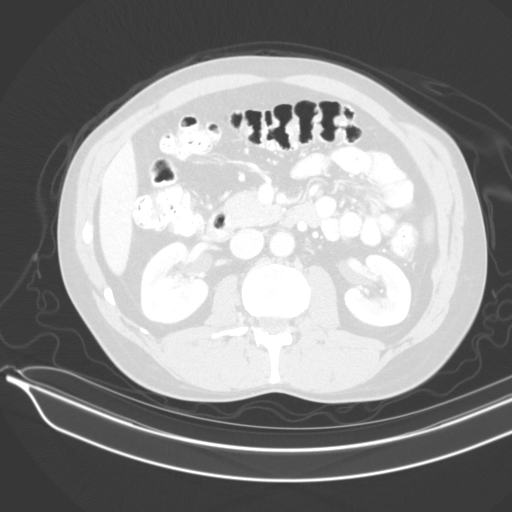
[im 47/79  soft-tissue]
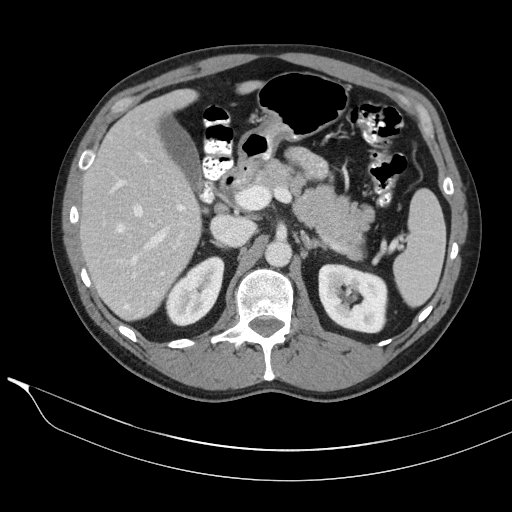
[im 47/79  lung]
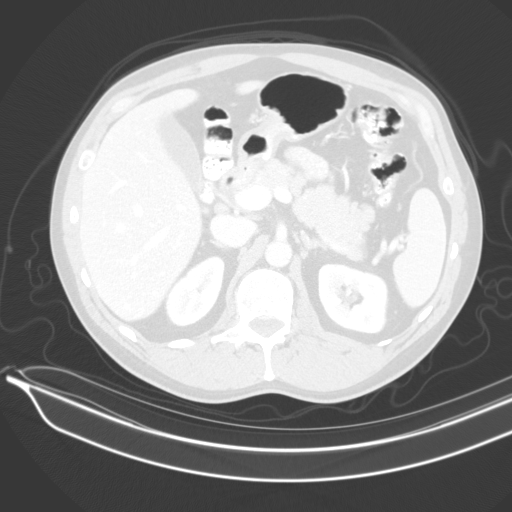
[im 63/79  soft-tissue]
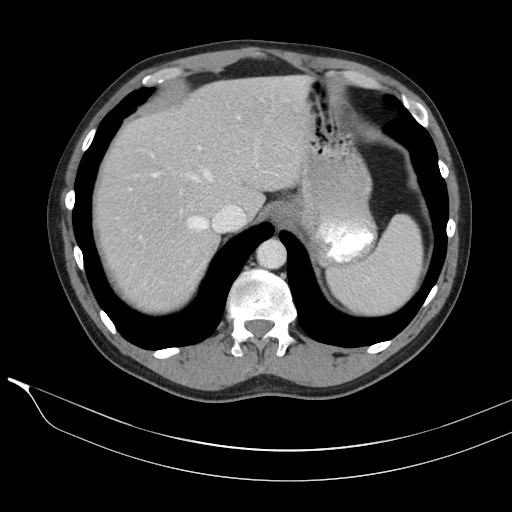
[im 63/79  lung]
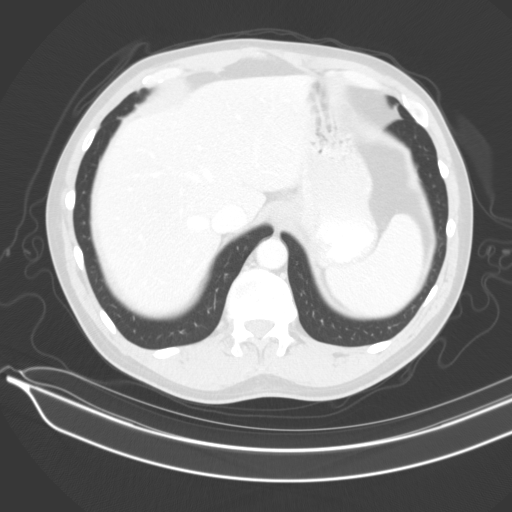

[Series 14: delay · axial · delayed · 0.77mm/px · z∈[-230,-155]mm · 2 of 47 slices shown]
[im 16/47  soft-tissue]
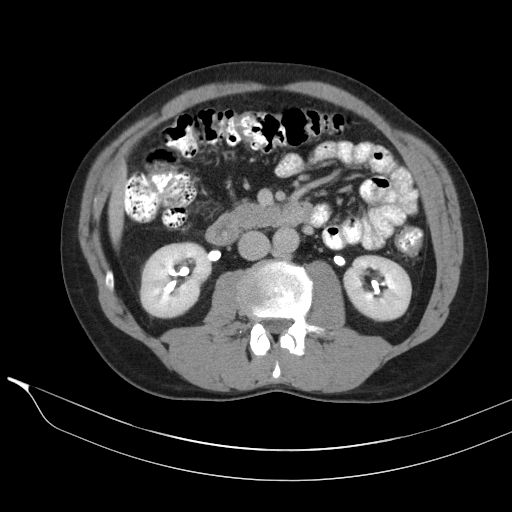
[im 31/47  soft-tissue]
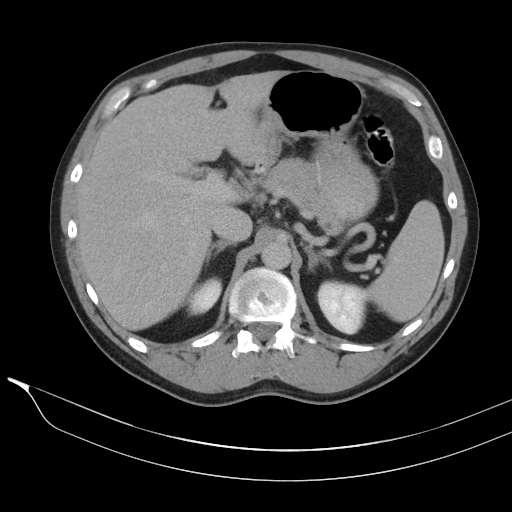

[12 of 32 positions shown; findings below may reference images not displayed]

FINDINGS: Lower chest: No signs of consolidation or evidence of pleural
effusion.

Hepatobiliary: No signs of focal, suspicious hepatic lesion. No
signs of cirrhotic morphology or nodular contour.

Accessory left hepatic artery arising from the left gastric artery.

Replaced right hepatic artery arises from SMA.

Portal vein is patent. Hepatic veins are patent.

Pancreas: Unremarkable. No pancreatic ductal dilatation or
surrounding inflammatory changes.

Spleen: Normal in size without focal abnormality.

Adrenals/Urinary Tract: Normal appearance of bilateral adrenal
glands. Symmetric enhancement of the bilateral kidneys without
suspicious renal lesion. Small low-density lesion in the posterior
left kidney likely represents a small cyst

Stomach/Bowel: No signs of acute gastrointestinal process. Appendix
partially imaged is unremarkable with respect imaged portions.

Vascular/Lymphatic: Variant vascular anatomy as described, also with
accessory right renal artery to lower pole. No signs of adenopathy.

Other: No sign of free air or ascites.

Musculoskeletal: No acute bone finding or evidence of destructive
bone process.
IMPRESSION: 1. No signs of cirrhosis or portal hypertension.
2. Variant vascular anatomy as described
3. Area of concern on the ultrasound likely related to adjacent
stomach.

## 2022-02-13 DIAGNOSIS — H52203 Unspecified astigmatism, bilateral: Secondary | ICD-10-CM | POA: Diagnosis not present

## 2022-02-13 DIAGNOSIS — H5203 Hypermetropia, bilateral: Secondary | ICD-10-CM | POA: Diagnosis not present

## 2022-10-05 DIAGNOSIS — R030 Elevated blood-pressure reading, without diagnosis of hypertension: Secondary | ICD-10-CM | POA: Diagnosis not present

## 2022-10-05 DIAGNOSIS — Z125 Encounter for screening for malignant neoplasm of prostate: Secondary | ICD-10-CM | POA: Diagnosis not present

## 2022-10-12 DIAGNOSIS — C8590 Non-Hodgkin lymphoma, unspecified, unspecified site: Secondary | ICD-10-CM | POA: Diagnosis not present

## 2022-10-12 DIAGNOSIS — Z0000 Encounter for general adult medical examination without abnormal findings: Secondary | ICD-10-CM | POA: Diagnosis not present

## 2022-10-12 DIAGNOSIS — R82998 Other abnormal findings in urine: Secondary | ICD-10-CM | POA: Diagnosis not present

## 2022-12-10 DIAGNOSIS — D2262 Melanocytic nevi of left upper limb, including shoulder: Secondary | ICD-10-CM | POA: Diagnosis not present

## 2022-12-10 DIAGNOSIS — D2271 Melanocytic nevi of right lower limb, including hip: Secondary | ICD-10-CM | POA: Diagnosis not present

## 2022-12-10 DIAGNOSIS — D2261 Melanocytic nevi of right upper limb, including shoulder: Secondary | ICD-10-CM | POA: Diagnosis not present

## 2022-12-10 DIAGNOSIS — D485 Neoplasm of uncertain behavior of skin: Secondary | ICD-10-CM | POA: Diagnosis not present

## 2022-12-10 DIAGNOSIS — L309 Dermatitis, unspecified: Secondary | ICD-10-CM | POA: Diagnosis not present

## 2022-12-10 DIAGNOSIS — D225 Melanocytic nevi of trunk: Secondary | ICD-10-CM | POA: Diagnosis not present

## 2022-12-10 DIAGNOSIS — L570 Actinic keratosis: Secondary | ICD-10-CM | POA: Diagnosis not present

## 2022-12-20 DIAGNOSIS — L821 Other seborrheic keratosis: Secondary | ICD-10-CM | POA: Diagnosis not present

## 2023-02-15 DIAGNOSIS — H524 Presbyopia: Secondary | ICD-10-CM | POA: Diagnosis not present

## 2023-02-15 DIAGNOSIS — H4323 Crystalline deposits in vitreous body, bilateral: Secondary | ICD-10-CM | POA: Diagnosis not present

## 2023-02-15 DIAGNOSIS — H5203 Hypermetropia, bilateral: Secondary | ICD-10-CM | POA: Diagnosis not present

## 2023-06-11 ENCOUNTER — Encounter: Payer: Self-pay | Admitting: Internal Medicine

## 2023-07-16 ENCOUNTER — Ambulatory Visit (AMBULATORY_SURGERY_CENTER): Payer: BC Managed Care – PPO | Admitting: *Deleted

## 2023-07-16 VITALS — Ht 74.0 in | Wt 195.0 lb

## 2023-07-16 DIAGNOSIS — Z860100 Personal history of colon polyps, unspecified: Secondary | ICD-10-CM

## 2023-07-16 MED ORDER — NA SULFATE-K SULFATE-MG SULF 17.5-3.13-1.6 GM/177ML PO SOLN: 1.0000 | Freq: Once | ORAL | 0 refills | Status: AC

## 2023-07-16 NOTE — Progress Notes (Signed)
Pt's name and DOB verified at the beginning of the pre-visit wit 2 identifiers  Pt denies any difficulty with ambulating,sitting, laying down or rolling side to side  Gave both [facility] main # and MD on call # prior to instructions.   No egg or soy allergy known to patient   No issues known to pt with past sedation with any surgeries or procedures  Pt denies having issues being intubated  Pt has no issues moving head neck or swallowing  No FH of Malignant Hyperthermia  Pt is not on diet pills or shots  Pt is not on home 02   Pt is not on blood thinners   Pt denies issues with constipation   Pt is not on dialysis  Pt denise any abnormal heart rhythms   Pt denies any upcoming cardiac testing  Pt encouraged to use to use Singlecare or Goodrx to reduce cost   Patient's chart reviewed by Cathlyn Parsons CNRA prior to pre-visit and patient appropriate for the [facility].  Pre-visit completed and red dot placed by patient's name on their procedure day (on provider's schedule).  .  Visit by phone  Pt states weight is 195 lb  Instructed pt why it is important to and  to call if they have any changes in health or new medications. Directed them to the # given and on instructions.     Instructions reviewed with pt and pt states understanding. Instructed to review again prior to procedure. Pt states they will.   Instructions sent by mail with coupon and by my chart

## 2023-07-22 ENCOUNTER — Encounter: Payer: Self-pay | Admitting: Internal Medicine

## 2023-07-22 ENCOUNTER — Telehealth: Payer: Self-pay | Admitting: Internal Medicine

## 2023-07-22 NOTE — Telephone Encounter (Signed)
Inbound call from patient's wife requesting a call to discuss coverage for 11/1 colonoscopy. Please advise, thank you.

## 2023-07-23 NOTE — Telephone Encounter (Signed)
Inbound call from patient requesting a call regarding previous note. Please advise, thank you.

## 2023-08-02 ENCOUNTER — Encounter: Payer: BC Managed Care – PPO | Admitting: Internal Medicine

## 2023-08-11 NOTE — Telephone Encounter (Signed)
I was doing recalls and see that this phone call is still open - can we address?

## 2023-08-20 NOTE — Telephone Encounter (Signed)
Spoke with pt wife Loletta Specter. Loletta Specter stated that they have found another Dr. And no longer needs our services.   Routed as [employer]

## 2023-08-20 NOTE — Telephone Encounter (Signed)
RN staff please contact patient and explain we are following up on scheduling his colonoscopy and are sorry for the delay regarding their coverage question but can we see what the question is  He has a hx of colon polyps and is due for colonoscopy  Thanks

## 2023-10-15 DIAGNOSIS — Z0000 Encounter for general adult medical examination without abnormal findings: Secondary | ICD-10-CM | POA: Diagnosis not present

## 2023-10-15 DIAGNOSIS — Z125 Encounter for screening for malignant neoplasm of prostate: Secondary | ICD-10-CM | POA: Diagnosis not present

## 2023-10-29 DIAGNOSIS — R82998 Other abnormal findings in urine: Secondary | ICD-10-CM | POA: Diagnosis not present

## 2023-10-29 DIAGNOSIS — C8590 Non-Hodgkin lymphoma, unspecified, unspecified site: Secondary | ICD-10-CM | POA: Diagnosis not present

## 2023-10-29 DIAGNOSIS — Z0000 Encounter for general adult medical examination without abnormal findings: Secondary | ICD-10-CM | POA: Diagnosis not present

## 2023-10-29 DIAGNOSIS — Z1212 Encounter for screening for malignant neoplasm of rectum: Secondary | ICD-10-CM | POA: Diagnosis not present

## 2023-10-29 DIAGNOSIS — Z1331 Encounter for screening for depression: Secondary | ICD-10-CM | POA: Diagnosis not present

## 2023-10-29 DIAGNOSIS — Z1339 Encounter for screening examination for other mental health and behavioral disorders: Secondary | ICD-10-CM | POA: Diagnosis not present

## 2023-10-31 DIAGNOSIS — K5730 Diverticulosis of large intestine without perforation or abscess without bleeding: Secondary | ICD-10-CM | POA: Diagnosis not present

## 2023-10-31 DIAGNOSIS — Z1211 Encounter for screening for malignant neoplasm of colon: Secondary | ICD-10-CM | POA: Diagnosis not present

## 2023-11-20 DIAGNOSIS — Z860101 Personal history of adenomatous and serrated colon polyps: Secondary | ICD-10-CM | POA: Diagnosis not present

## 2023-11-20 DIAGNOSIS — D125 Benign neoplasm of sigmoid colon: Secondary | ICD-10-CM | POA: Diagnosis not present

## 2023-11-20 DIAGNOSIS — Z1211 Encounter for screening for malignant neoplasm of colon: Secondary | ICD-10-CM | POA: Diagnosis not present

## 2023-11-20 DIAGNOSIS — K5730 Diverticulosis of large intestine without perforation or abscess without bleeding: Secondary | ICD-10-CM | POA: Diagnosis not present

## 2023-11-20 DIAGNOSIS — K621 Rectal polyp: Secondary | ICD-10-CM | POA: Diagnosis not present

## 2023-11-20 DIAGNOSIS — K635 Polyp of colon: Secondary | ICD-10-CM | POA: Diagnosis not present

## 2023-11-20 DIAGNOSIS — Z860102 Personal history of hyperplastic colon polyps: Secondary | ICD-10-CM | POA: Diagnosis not present

## 2024-02-18 DIAGNOSIS — H4322 Crystalline deposits in vitreous body, left eye: Secondary | ICD-10-CM | POA: Diagnosis not present

## 2024-02-18 DIAGNOSIS — H5203 Hypermetropia, bilateral: Secondary | ICD-10-CM | POA: Diagnosis not present
